# Patient Record
Sex: Female | Born: 1995 | Race: White | Hispanic: No | Marital: Single
Health system: Southern US, Community
[De-identification: ages and names within clinical notes are randomized; demographics above are authoritative.]

---

## 2016-12-29 ENCOUNTER — Encounter: Payer: Self-pay | Admitting: Emergency Medicine

## 2016-12-29 ENCOUNTER — Emergency Department
Admission: EM | Admit: 2016-12-29 | Discharge: 2016-12-29 | Disposition: A | Discharge status: Home / Self Care | Payer: Medicaid Other | Attending: Student in an Organized Health Care Education/Training Program | Admitting: Student in an Organized Health Care Education/Training Program

## 2016-12-29 ENCOUNTER — Emergency Department: Payer: Medicaid Other

## 2016-12-29 DIAGNOSIS — S6991XA Unspecified injury of right wrist, hand and finger(s), initial encounter: Secondary | ICD-10-CM | POA: Diagnosis present

## 2016-12-29 DIAGNOSIS — Y999 Unspecified external cause status: Secondary | ICD-10-CM | POA: Diagnosis not present

## 2016-12-29 DIAGNOSIS — M25531 Pain in right wrist: Secondary | ICD-10-CM | POA: Diagnosis not present

## 2016-12-29 DIAGNOSIS — Y9351 Activity, roller skating (inline) and skateboarding: Secondary | ICD-10-CM | POA: Diagnosis not present

## 2016-12-29 DIAGNOSIS — Y929 Unspecified place or not applicable: Secondary | ICD-10-CM | POA: Diagnosis not present

## 2016-12-29 NOTE — ED Triage Notes (Signed)
Patient reports at skating rink last night and fell.  Reports right hand pain at base of right thumb.  Patient reports she fell because she passed out, patient states she is only here to have her hand checked.

## 2016-12-29 NOTE — ED Provider Notes (Signed)
[facility] Emergency Department Provider Note    None    (approximate)  I have reviewed the triage vital signs and the nursing notes.   HISTORY  Chief Complaint Hand Pain    HPI Tammy Harmon is a 21 y.o. female complaint of right wrist pain sustained while skating last night.  Feel onto outstretched hand.  No head injury.Does admit to neck soreness but no significant pain.  Able to ambulate.  Righth and dominant   PMH: previoulsy healthy FMH: no bleeding disorders PSH: no recent surgeries There are no active problems to display for this patient.     Prior to Admission medications   Not on File    Allergies Amoxicillin    Social History Social History  Substance Use Topics  . Smoking status: Not on file  . Smokeless tobacco: Not on file  . Alcohol use Not on file    Review of Systems Patient denies headaches, rhinorrhea, blurry vision, numbness, shortness of breath, chest pain, edema, cough, abdominal pain, nausea, vomiting, diarrhea, dysuria, fevers, rashes or hallucinations unless otherwise stated above in HPI. ____________________________________________   PHYSICAL EXAM:  VITAL SIGNS: Vitals:   12/29/16 1933  BP: 135/61  Pulse: 81  Resp: 18  Temp: 97.4 F (36.3 C)    Constitutional: Alert and oriented. Well appearing and in no acute distress. Eyes: Conjunctivae are normal. PERRL. EOMI. Head: Atraumatic. Nose: No congestion/rhinnorhea. Mouth/Throat: Mucous membranes are moist.  Oropharynx non-erythematous. Neck: No stridor. Painless ROM. No cervical spine tenderness to palpation Hematological/Lymphatic/Immunilogical: No cervical lymphadenopathy. Cardiovascular: Normal rate, regular rhythm. Grossly normal heart sounds.  Good peripheral circulation. Respiratory: Normal respiratory effort.  No retractions. Lungs CTAB. Gastrointestinal: Soft and nontender. No distention. No abdominal bruits. No CVA  tenderness. Musculoskeletal: No lower extremity tenderness nor edema.  No joint effusions.  Swelling and ttp of base of right thumb.  No snuff box ttp.  Extensor and flexor mechanisms intact  Neurologic:  Normal speech and language. No gross focal neurologic deficits are appreciated. No gait instability. Skin:  Skin is warm, dry and intact. No rash noted. Psychiatric: Mood and affect are normal. Speech and behavior are normal.  ____________________________________________   LABS (all labs ordered are listed, but only abnormal results are displayed)  No results found for this or any previous visit (from the past 24 hour(s)). ____________________________________________  EKG ____________________________________________  RADIOLOGY  I personally reviewed all radiographic images ordered to evaluate for the above acute complaints and reviewed radiology reports and findings.  These findings were personally discussed with the patient.  Please see medical record for radiology report.  ____________________________________________   PROCEDURES  Procedure(s) performed:  Procedures    Critical Care performed: no ____________________________________________   INITIAL IMPRESSION / ASSESSMENT AND PLAN / ED COURSE  Pertinent labs & imaging results that were available during my care of the patient were reviewed by me and considered in my medical decision making (see chart for details).  DDX: fracture, sprain, contusion, bruise  Tammy Harmon is a 21 y.o. who presents to the ED with right wrist injury. Denies any other injuries. Denies motor or sensory loss. Denies paresthesias. VSS in ED. Exam as above. NV intact throughout and distal to injury. Cap refill <2 seconds. Pt able to range joint. TTP at wrist but no anatomical snuffbox tenderness. No clinical suspicion for infectious process or septic joint. More consistent with contusion/sprain/fracture.  Treatments will include observation,  X-rays   Discussed supportive care, wrist splint, and follow up  with pt.       ____________________________________________   FINAL CLINICAL IMPRESSION(S) / ED DIAGNOSES  Final diagnoses:  Right wrist pain  Other non-in-line roller-skating accident, initial encounter      NEW MEDICATIONS STARTED DURING THIS VISIT:  New Prescriptions   No medications on file     Note:  This document was prepared using Dragon voice recognition software and may include unintentional dictation errors.    Willy Eddy, MD 12/29/16 [id number]

## 2017-11-09 IMAGING — DX DG WRIST COMPLETE 3+V*R*
4 series · 4 of 4 positions shown · non-contrast
Comparison: None.

CLINICAL DATA: Fell at skating rink last night. Pain at the base of
thumb.

EXAM:
RIGHT WRIST - COMPLETE 3+ VIEW

[wrist ap (1 of 2)]
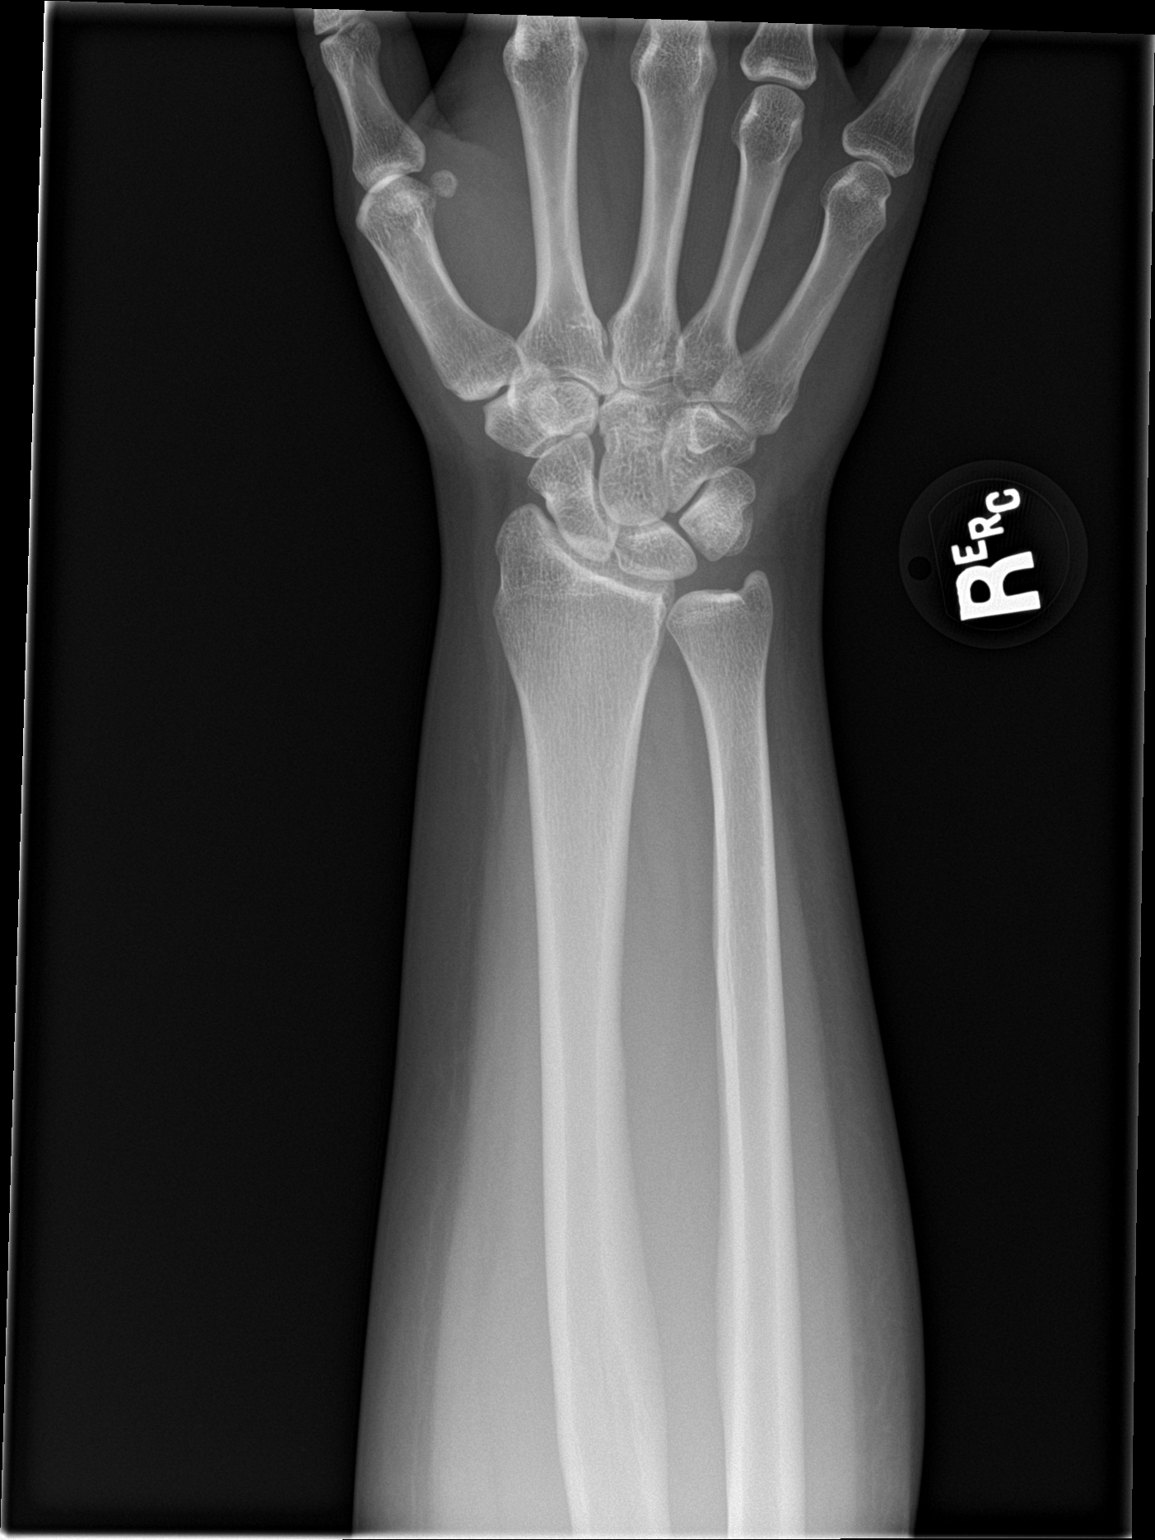

[wrist obl]
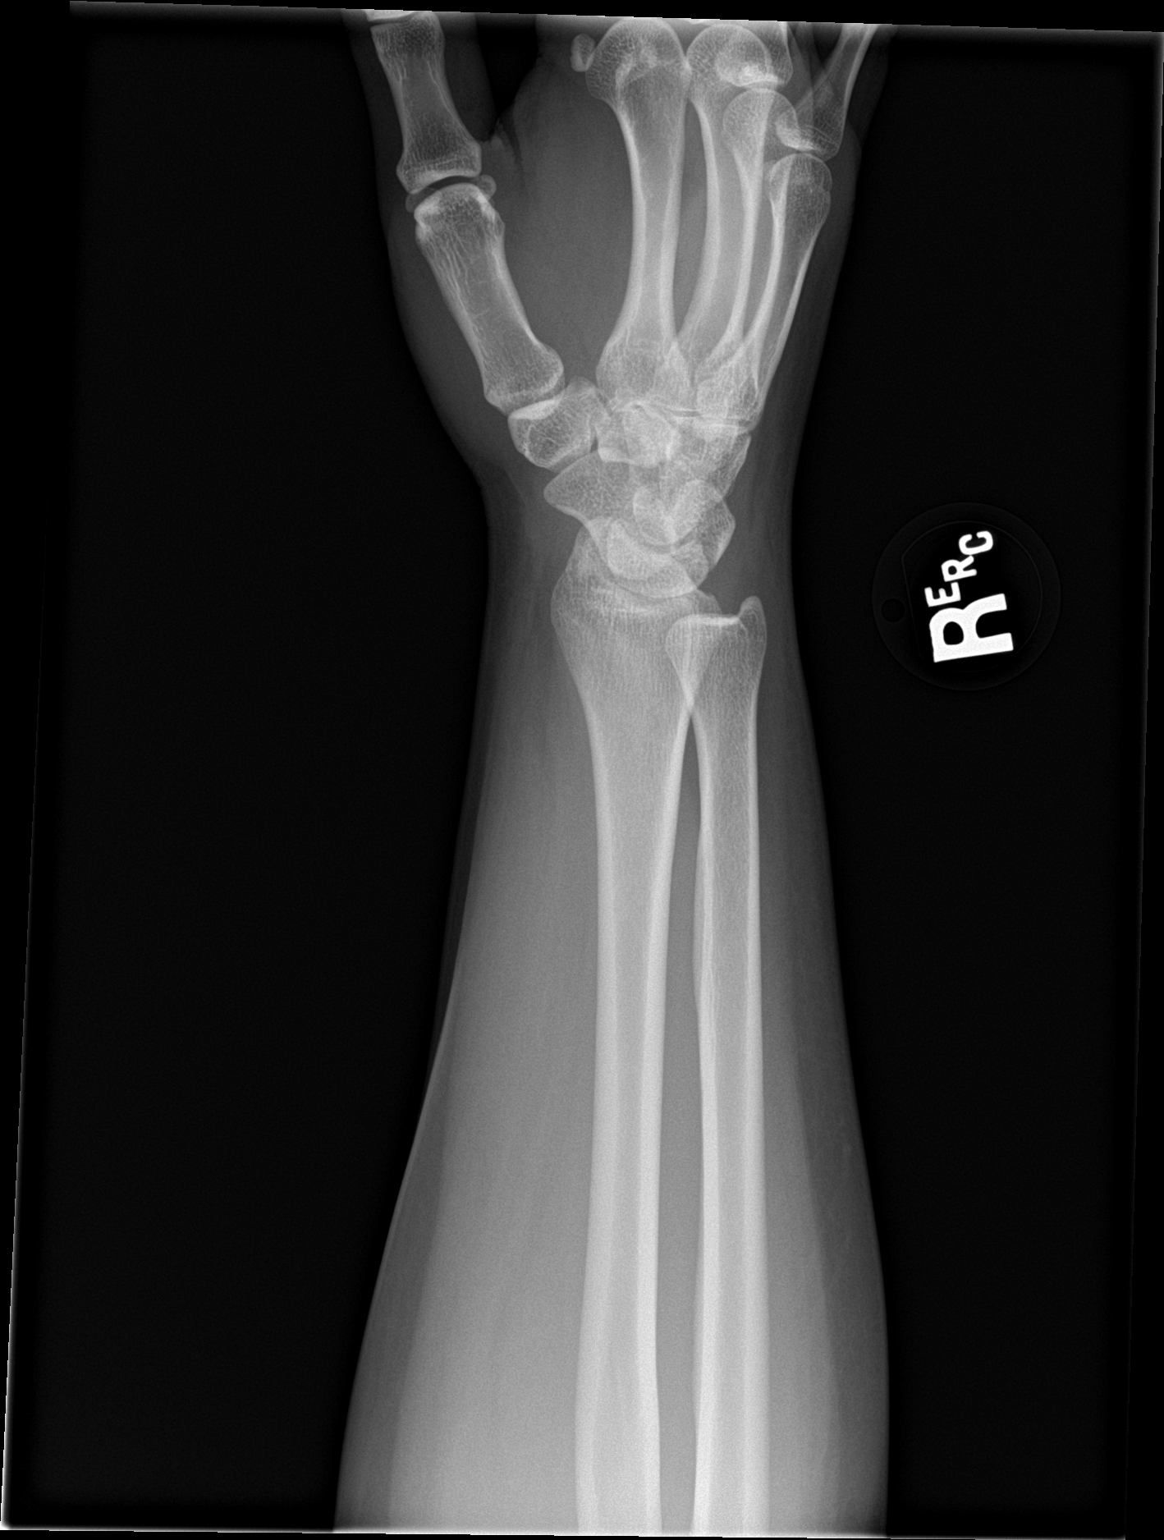

[wrist lat]
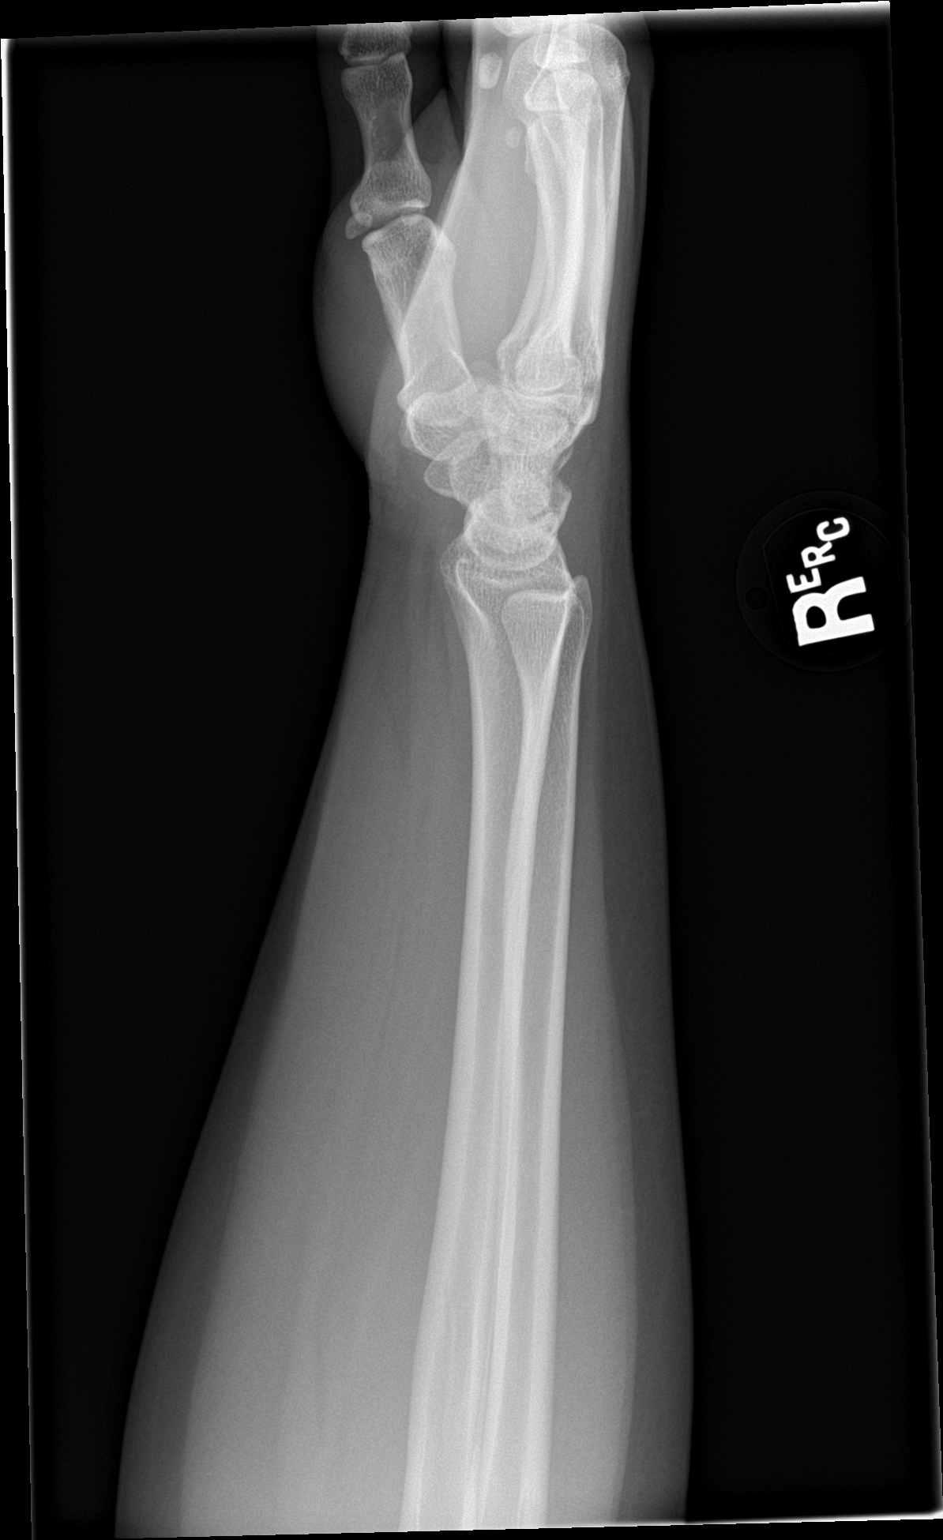

[wrist ap (2 of 2)]
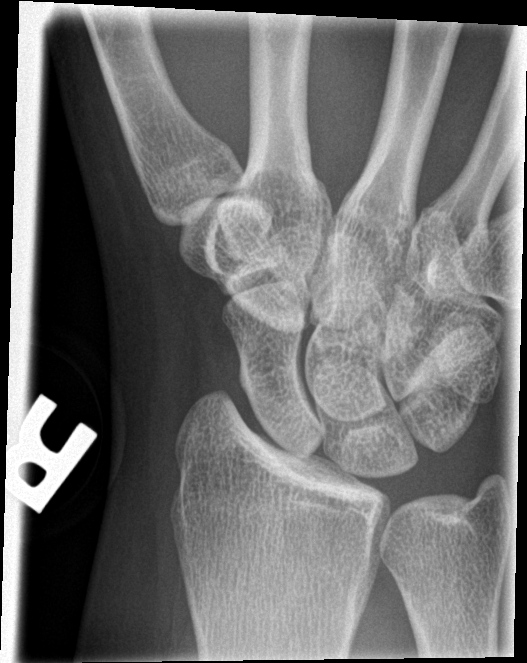

[4 of 4 positions shown; findings below may reference images not displayed]

FINDINGS: There is no evidence of fracture or dislocation. There is no
evidence of arthropathy or other focal bone abnormality. Soft
tissues are unremarkable.
IMPRESSION: Negative.
